# Patient Record
Sex: Female | Born: 1955
Health system: Southern US, Community
[De-identification: ages and names within clinical notes are randomized; demographics above are authoritative.]

## PROBLEM LIST (undated history)

## (undated) DIAGNOSIS — N289 Disorder of kidney and ureter, unspecified: Secondary | ICD-10-CM

## (undated) HISTORY — PX: APPENDECTOMY: SHX54

## (undated) HISTORY — PX: ABDOMINAL HYSTERECTOMY: SHX81

## (undated) HISTORY — PX: TONSILLECTOMY: SUR1361

## (undated) HISTORY — PX: CHOLECYSTECTOMY: SHX55

---

## 1998-03-24 ENCOUNTER — Ambulatory Visit (HOSPITAL_COMMUNITY): Admission: RE | Admit: 1998-03-24 | Discharge: 1998-03-24 | Payer: Self-pay | Admitting: Orthopedic Surgery

## 1999-06-24 ENCOUNTER — Other Ambulatory Visit: Admission: RE | Admit: 1999-06-24 | Discharge: 1999-06-24 | Payer: Self-pay | Admitting: Obstetrics and Gynecology

## 2001-10-16 ENCOUNTER — Other Ambulatory Visit: Admission: RE | Admit: 2001-10-16 | Discharge: 2001-10-16 | Payer: Self-pay | Admitting: Obstetrics and Gynecology

## 2003-12-30 ENCOUNTER — Other Ambulatory Visit: Admission: RE | Admit: 2003-12-30 | Discharge: 2003-12-30 | Payer: Self-pay | Admitting: Obstetrics and Gynecology

## 2004-01-08 ENCOUNTER — Encounter: Admission: RE | Admit: 2004-01-08 | Discharge: 2004-01-08 | Payer: Self-pay | Admitting: Obstetrics and Gynecology

## 2004-02-13 ENCOUNTER — Ambulatory Visit (HOSPITAL_COMMUNITY): Admission: RE | Admit: 2004-02-13 | Discharge: 2004-02-13 | Payer: Self-pay | Admitting: Obstetrics and Gynecology

## 2004-02-13 ENCOUNTER — Encounter (INDEPENDENT_AMBULATORY_CARE_PROVIDER_SITE_OTHER): Payer: Self-pay | Admitting: *Deleted

## 2004-08-12 ENCOUNTER — Encounter: Admission: RE | Admit: 2004-08-12 | Discharge: 2004-08-12 | Payer: Self-pay | Admitting: Obstetrics and Gynecology

## 2004-08-27 ENCOUNTER — Encounter (INDEPENDENT_AMBULATORY_CARE_PROVIDER_SITE_OTHER): Payer: Self-pay | Admitting: Specialist

## 2004-08-28 ENCOUNTER — Inpatient Hospital Stay (HOSPITAL_COMMUNITY): Admission: RE | Admit: 2004-08-28 | Discharge: 2004-08-29 | Payer: Self-pay | Admitting: Obstetrics and Gynecology

## 2005-06-13 ENCOUNTER — Encounter: Admission: RE | Admit: 2005-06-13 | Discharge: 2005-06-13 | Payer: Self-pay | Admitting: Orthopedic Surgery

## 2007-03-20 ENCOUNTER — Emergency Department (HOSPITAL_COMMUNITY): Admission: EM | Admit: 2007-03-20 | Discharge: 2007-03-20 | Payer: Self-pay | Admitting: Emergency Medicine

## 2007-06-30 ENCOUNTER — Emergency Department (HOSPITAL_COMMUNITY): Admission: EM | Admit: 2007-06-30 | Discharge: 2007-06-30 | Payer: Self-pay | Admitting: Emergency Medicine

## 2007-07-07 ENCOUNTER — Emergency Department (HOSPITAL_COMMUNITY): Admission: EM | Admit: 2007-07-07 | Discharge: 2007-07-07 | Payer: Self-pay | Admitting: Emergency Medicine

## 2007-10-26 ENCOUNTER — Emergency Department (HOSPITAL_COMMUNITY): Admission: EM | Admit: 2007-10-26 | Discharge: 2007-10-26 | Payer: Self-pay | Admitting: Emergency Medicine

## 2007-10-27 ENCOUNTER — Emergency Department (HOSPITAL_COMMUNITY): Admission: EM | Admit: 2007-10-27 | Discharge: 2007-10-27 | Payer: Self-pay | Admitting: Emergency Medicine

## 2008-03-29 ENCOUNTER — Emergency Department (HOSPITAL_COMMUNITY): Admission: EM | Admit: 2008-03-29 | Discharge: 2008-03-29 | Payer: Self-pay | Admitting: Emergency Medicine

## 2008-07-18 ENCOUNTER — Emergency Department (HOSPITAL_COMMUNITY): Admission: EM | Admit: 2008-07-18 | Discharge: 2008-07-18 | Payer: Self-pay | Admitting: Emergency Medicine

## 2009-01-05 ENCOUNTER — Emergency Department (HOSPITAL_COMMUNITY): Admission: EM | Admit: 2009-01-05 | Discharge: 2009-01-05 | Payer: Self-pay | Admitting: Family Medicine

## 2009-01-07 ENCOUNTER — Inpatient Hospital Stay (HOSPITAL_COMMUNITY): Admission: AD | Admit: 2009-01-07 | Discharge: 2009-01-07 | Payer: Self-pay | Admitting: Obstetrics and Gynecology

## 2009-05-04 ENCOUNTER — Emergency Department (HOSPITAL_COMMUNITY): Admission: EM | Admit: 2009-05-04 | Discharge: 2009-05-04 | Payer: Self-pay | Admitting: Emergency Medicine

## 2009-05-08 ENCOUNTER — Ambulatory Visit (HOSPITAL_BASED_OUTPATIENT_CLINIC_OR_DEPARTMENT_OTHER): Admission: RE | Admit: 2009-05-08 | Discharge: 2009-05-08 | Payer: Self-pay | Admitting: Urology

## 2009-08-13 ENCOUNTER — Emergency Department (HOSPITAL_COMMUNITY): Admission: EM | Admit: 2009-08-13 | Discharge: 2009-08-13 | Payer: Self-pay | Admitting: Emergency Medicine

## 2009-11-27 ENCOUNTER — Emergency Department (HOSPITAL_COMMUNITY): Admission: EM | Admit: 2009-11-27 | Discharge: 2009-11-27 | Payer: Self-pay | Admitting: Family Medicine

## 2010-06-01 ENCOUNTER — Emergency Department (HOSPITAL_COMMUNITY): Admission: EM | Admit: 2010-06-01 | Discharge: 2010-06-01 | Payer: Self-pay | Admitting: Family Medicine

## 2010-11-13 LAB — DIFFERENTIAL
Basophils Absolute: 0 10*3/uL (ref 0.0–0.1)
Basophils Relative: 0 % (ref 0–1)
Eosinophils Absolute: 0 10*3/uL (ref 0.0–0.7)
Eosinophils Relative: 0 % (ref 0–5)
Lymphocytes Relative: 24 % (ref 12–46)
Lymphs Abs: 2 10*3/uL (ref 0.7–4.0)
Monocytes Absolute: 0.5 10*3/uL (ref 0.1–1.0)
Monocytes Relative: 6 % (ref 3–12)
Neutro Abs: 5.8 10*3/uL (ref 1.7–7.7)
Neutrophils Relative %: 69 % (ref 43–77)

## 2010-11-13 LAB — URINALYSIS, ROUTINE W REFLEX MICROSCOPIC
Glucose, UA: NEGATIVE mg/dL
Ketones, ur: 15 mg/dL — AB
Nitrite: POSITIVE — AB
Protein, ur: 100 mg/dL — AB
Specific Gravity, Urine: 1.025 (ref 1.005–1.030)
Urobilinogen, UA: 0.2 mg/dL (ref 0.0–1.0)
pH: 5 (ref 5.0–8.0)

## 2010-11-13 LAB — CBC
HCT: 41.9 % (ref 36.0–46.0)
Hemoglobin: 14.4 g/dL (ref 12.0–15.0)
MCHC: 34.4 g/dL (ref 30.0–36.0)
MCV: 88 fL (ref 78.0–100.0)
Platelets: 246 10*3/uL (ref 150–400)
RBC: 4.76 MIL/uL (ref 3.87–5.11)
RDW: 13 % (ref 11.5–15.5)
WBC: 8.4 10*3/uL (ref 4.0–10.5)

## 2010-11-13 LAB — COMPREHENSIVE METABOLIC PANEL
ALT: 27 U/L (ref 0–35)
AST: 27 U/L (ref 0–37)
Albumin: 4 g/dL (ref 3.5–5.2)
Alkaline Phosphatase: 78 U/L (ref 39–117)
BUN: 11 mg/dL (ref 6–23)
CO2: 27 mEq/L (ref 19–32)
Calcium: 9.3 mg/dL (ref 8.4–10.5)
Chloride: 108 mEq/L (ref 96–112)
Creatinine, Ser: 1.06 mg/dL (ref 0.4–1.2)
GFR calc Af Amer: >60 mL/min (ref >60)
GFR calc non Af Amer: 54 mL/min — ABNORMAL LOW (ref >60)
Glucose, Bld: 112 mg/dL — ABNORMAL HIGH (ref 70–99)
Potassium: 4.1 mEq/L (ref 3.5–5.1)
Sodium: 139 mEq/L (ref 135–145)
Total Bilirubin: 0.8 mg/dL (ref 0.3–1.2)
Total Protein: 7.7 g/dL (ref 6.0–8.3)

## 2010-11-13 LAB — URINE MICROSCOPIC-ADD ON

## 2010-11-13 LAB — POCT HEMOGLOBIN-HEMACUE: Hemoglobin: 15.2 g/dL — ABNORMAL HIGH (ref 12.0–15.0)

## 2010-11-16 LAB — CULTURE, ROUTINE-ABSCESS

## 2010-12-25 NOTE — Discharge Summary (Signed)
[name], [name]           ACCOUNT NO.:  [id number]   MEDICAL RECORD NO.:  [id number]          PATIENT TYPE:  INP   LOCATION:  9311                          FACILITY:  [facility]   PHYSICIAN:  Juluis Mire, M.D.   DATE OF BIRTH:  02-Jul-1956   DATE OF ADMISSION:  08/27/2004  DATE OF DISCHARGE:                                 DISCHARGE SUMMARY   ADMITTING DIAGNOSIS:  Atypical andenomatous hyperplasia and uterine  fibroids.   DISCHARGE DIAGNOSIS:  Atypical adenomatous hyperplasia and uterine fibroids.   OPERATIVE PROCEDURE:  Laparoscopic-assisted vaginal hysterectomy with  bilateral salpingo-oophorectomy.   For complete history and physical, please see the dictated note.   COURSE IN THE HOSPITAL:  The patient underwent open laparoscopy.  She did  have multiple fibroids and large pelvic congestion.  She underwent a  laparoscopic-assisted vaginal hysterectomy with bilateral salpingo-  oophorectomy.  Pathology did reveal uterine fibroids.  There was no evidence  of persistent atypical hyperplasia.  She did well postoperatively.  Postoperative hemoglobin was 10.7.  She was discharged home on postoperative  day #2. She remained during the first due to an apparent ileus with  associated nausea requiring continued maintenance.  At the time of her  discharge, though, she was afebrile, tolerating a regular diet, she had  passed flatus.  All incisions were intact and clear.  She was voiding  without difficulty and had no active vaginal bleeding.   COMPLICATIONS:  None encountered during her stay in the hospital.  The  patient discharged home in stable condition.   DISPOSITION:  Routine postoperative instructions were given.  She was to  avoid heavy lifting, vaginal entrance, or driving a car.  She is to watch  for signs of infection, nausea, vomiting, increasing abdominal pain, or  active vaginal bleeding.  Follow-up in the office will be in 1 week.  Medications included Tylox as needed  for pain.      JSM/MEDQ  D:  08/29/2004  T:  08/29/2004  Job:  [id number]

## 2010-12-25 NOTE — H&P (Signed)
NAME:  [name], [name]                     ACCOUNT NO.:  [id number]   MEDICAL RECORD NO.:  [id number]                   PATIENT TYPE:  AMB   LOCATION:  SDC                                  [facility]ACILITY:  [facility]   PHYSICIAN:  Juluis Mire, M.D.                DATE OF BIRTH:  1955-08-13   DATE OF ADMISSION:  DATE OF DISCHARGE:                                HISTORY & PHYSICAL   The patient is a 56 year old gravida, para 2, black female who presents for  hysteroscopic evaluation.  The patient was experiencing abnormal uterine  bleeding.  Saline infusion ultrasound revealed two large endometrial polyps  along with uterine fibroids.  She now presents for a hysteroscopic  resection.   In terms of allergies, no known drug allergies.   MEDICATIONS:  None.   PAST MEDICAL HISTORY:  Usual childhood disease, no significant sequelae.   PAST SURGICAL HISTORY:  She has had a previous laparoscopic right salpingo-  oophorectomy with finding of a benign serous cystadenoma.  She has had a  cholecystectomy, appendectomy, and bilateral tubal ligation.   PAST SURGICAL HISTORY:  Two spontaneous vaginal deliveries.   FAMILY HISTORY:  Noncontributory.   SOCIAL HISTORY:  No tobacco or alcohol use.   REVIEW OF SYSTEMS:  Noncontributory.   PHYSICAL EXAMINATION:  VITAL SIGNS:  The patient is afebrile with stable  vital signs.  HEENT:  The patient is normocephalic, pupils equal, round, and reactive to  light and accommodation, extraocular movements were intact.  Sclerae and  conjunctivae clear, oropharynx clear.  NECK:  Without thyromegaly.  BREASTS:  No discrete masses.  CHEST:  Lungs clear.  CARDIAC:  Regular rhythm and rate without murmurs or gallops.  ABDOMEN:  Benign, no mass, organomegaly, or tenderness.  PELVIC:  Normal external genitalia.  Vaginal mucosa clear.  Cervix  unremarkable.  Uterus is enlarged.  There is a right-sided fullness  consistent with known uterine fibroids.  Adnexa  unremarkable.  Rectovaginal  exam is clear.  EXTREMITIES:  Trace edema.  NEUROLOGIC:  Grossly within normal limits.   IMPRESSION:  1. Abnormal uterine bleeding with evidence of endometrial polyp.  2. Uterine fibroids.   PLAN:  The patient will undergo hysteroscopic evaluation and resection of  polyps.  The risks have been discussed, including the risk of infection;  risk of hemorrhage that could require transfusion with the risk of AIDS or  hepatitis; risk of injury to adjacent organs that could require laparoscopy  or possible exploratory surgery; the risk of deep venous thrombosis and  pulmonary embolus; the risk of fluid overload leading to hyponatremia or  pulmonary edema.                                               Juluis Mire, M.D.    JSM/MEDQ  D:  02/13/2004  T:  02/13/2004  Job:  [id number]

## 2010-12-25 NOTE — Op Note (Signed)
NAME:  [name], [name]                     ACCOUNT NO.:  [id number]   MEDICAL RECORD NO.:  [id number]                   PATIENT TYPE:  AMB   LOCATION:  SDC                                  [facility]   PHYSICIAN:  Juluis Mire, M.D.                DATE OF BIRTH:  10/18/1955   DATE OF PROCEDURE:  02/13/2004  DATE OF DISCHARGE:                                 OPERATIVE REPORT   PREOPERATIVE DIAGNOSES:  1. Abnormal uterine bleeding.  2. Uterine fibroids.  3. Endometrial polyps.   POSTOPERATIVE DIAGNOSES:  1. Abnormal uterine bleeding.  2. Uterine fibroids.  3. Endometrial polyps.   1. Endocervical polyp.   PROCEDURES:  1. Cervical dilatation, hysteroscopy.  2. Resection of endometrial polyps.  3. Multiple endometrial biopsies.  4. Endometrial curettings.  5. Excision of endocervical polyp.   SURGEON:  Juluis Mire, M.D.   ANESTHESIA:  General anesthesia.   ESTIMATED BLOOD LOSS:  Minimal.   PACKS AND DRAINS:  None.   INTRAOPERATIVE BLOOD PLACED:  None.   COMPLICATIONS:  None.   INDICATIONS FOR PROCEDURE:  Dictated in history and physical.   DESCRIPTION OF PROCEDURE:  The patient taken to the OR and placed in the  supine position.  After satisfactory level of general anesthesia was  obtained, the patient was placed in the dorsal lithotomy position.  The  perineum and vagina were prepped with Betadine and draped for sterile field.  A speculum was placed in the vaginal vault.  The cervix was grasped with  single-tooth tenaculum.  An endocervical polyp was noted.  It was excised  and removed.  Uterus was retroverted and sounded to approximately 12 cm.  The cervix was serially dilated to a size 37 Pratt dilator.  Operative  hysteroscope was then introduced.  A posterior wall endometrial polyp was  noted and excised.  Multiple endometrial biopsies were obtained.  Endometrium was otherwise clear.  Endometrial curettings were also obtained.  At the end of the  procedure, we had no active bleeding or signs of  perforation.  The single-tooth tenaculum and speculum were then removed.  The patient was taken out of the dorsal lithotomy position, and once alert  and extubated, was transferred to the recovery room in good condition.  Sponge, needle and instrument counts were reported as correct by the  circulating nurse.                                               Juluis Mire, M.D.    JSM/MEDQ  D:  02/13/2004  T:  02/13/2004  Job:  [id number]

## 2010-12-25 NOTE — Op Note (Signed)
[name], [name]           ACCOUNT NO.:  [id number]   MEDICAL RECORD NO.:  [id number]          PATIENT TYPE:  AMB   LOCATION:  SDC                           [facility]   PHYSICIAN:  Juluis Mire, M.D.   DATE OF BIRTH:  1956/02/24   DATE OF PROCEDURE:  08/27/2004  DATE OF DISCHARGE:                                 OPERATIVE REPORT   PREOPERATIVE DIAGNOSIS:  Atypical adenomatous hyperplasia.  Uterine  fibroids.   POSTOPERATIVE DIAGNOSIS:  Atypical adenomatous hyperplasia.  Uterine  fibroids.   OPERATIVE PROCEDURE:  Laparoscopic assisted vaginal hysterectomy, bilateral  salpingo-oophorectomy.   SURGEON:  Juluis Mire, M.D.   ASSISTANT:  Raynald Kemp, M.D.   ANESTHESIA:  General endotracheal.   ESTIMATED BLOOD LOSS:  400 cubic centimeters.   PACKS AND DRAINS:  None.   INTRAOPERATIVE BLOOD REPLACEMENT:  None.   COMPLICATIONS:  None.   INDICATION:  See dictated history and physical.   DESCRIPTION OF PROCEDURE:  The patient was taken to the OR and placed in the  supine position.  After satisfactory level of general endotracheal  anesthesia was obtained, the patient was placed in the dorsal supine  position using the Allen stirrups.  The abdomen, peritoneum, and vagina were  prepped out with Betadine.  Bladder was emptied without catheterization.  A  Hulka tenaculum was put in place and secured.  The patient was then draped  out as a sterile field.  Subumbilical incision was made with the knife.  The  incision was extended through the subcutaneous tissue.  Fascia was  identified and entered sharply and incision pouch extended laterally.  Muscles were separated.  Peritoneum was entered.  The Taut open laparoscopic  trocar was put in place and secured.  The laparoscope was introduced.  There  was no incidental injury to adjacent organs.  The 5 mm trocar was put in  place in the suprapubic area under direct visualization.  Uterus was  enlarged approximately 2-3  times normal size by at least two fundal  fibroids.  Both ovaries appeared to be normal.  She had evidence of previous  bilateral tubal ligation.  First, we elevated the right ovary, identified  the ureter.  Then, using the Gyrus bipolar, we cauterized and incised the  ovarian vasculature.  We cauterized and incised the peritoneal attachment of  the ovary.  We then cauterized and incised the right round ligament.  We had  good hemostasis.  We then went to the left side.  The left ovary was  elevated.  Ureter was identified.  The left ovarian vasculature was  cauterized and incised.  Peritoneal attachment of the ovary was then  cauterized and incised, and the left round ligament was cauterized and  incised.  There was large vasculature in the pelvis due to the fibroids.  At  this point in time, we had adequate freeing of the ovaries.  The decision  was to go vaginally.  Abdomen was deflated with carbon dioxide.  The  laparoscope was removed.   The patient's legs were repositioned.  The Hulka tenaculum was then removed.  The weighted  speculum was placed in the vaginal vault.  The cervix was  grasped with the [facility] tenaculum.  Cul de sac was entered sharply.  The  left uterosacral ligaments were clamped, cut, and suture ligated with 0  Vicryl.  The reflection of the vaginal mucosa anteriorly was incised using  the Bovie, and the bladder was dissected superiorly.  Large venous areas  were noted and were bleeding.  These were brought under control with  clamping, cutting, and tying with suture ligatures of 0 Vicryl.  Next, the  paracervical tissue was clamped, cut, and suture ligated with 0 Vicryl.  The  vesicouterine space was identified and entered.  Using the clamp, cut, and  tie technique with suture ligatures of 0 Vicryl, the parametrium was  serially separated from the sides of the uterus.  Uterus was then flipped.  Remaining pedicles on the patient's right side was clamped and cut.   The  uterus, tubes, and ovaries were passed off the operative field.  Held  pedicle was secured with two free ties of 0 Vicryl.  We had good hemostasis.  Uterosacral plication stitch of 0 Vicryl was put in place and tied down.  Vaginal mucosa was reapproximated in the midline in a vertical fashion with  interrupted figure-of-eights of 0 Vicryl.  A sponge on a sponge stick was  placed in the vaginal vault.  A Foley was placed to straight drain with  adequate amount of clear urine.   At this point in time, the patient's legs were repositioned, the abdomen was  reinflated with carbon dioxide.  The laparoscope was reintroduced.  Visualization of vaginal cuff and both ovarian pedicles revealed good  hemostasis.  We clearly irrigated the pelvis, we de-inflated the pelvis,  revisualized, and no bleeding was encountered.  At this point in time, the  abdomen was deflated of carbon dioxide.  All trocars were removed.  Subumbilical fascia was closed with two figure-of-eights of 0 Vicryl, skin  and mucosa with interrupted subcuticulars of 4-0 Vicryl.  Suprapubic  incision was closed with Dermabond.  Sponge on a sponge stick was removed  from the vaginal vault.  The patient was taken out of the dorsal supine  position, extubated, and transferred to the recovery room in good condition.  Sponge, instrument, and needle counts were correct by circulating nurse x 2.  Foley catheter remained clear at time of closure.      JSM/MEDQ  D:  08/27/2004  T:  08/27/2004  Job:  [id number]

## 2010-12-25 NOTE — H&P (Signed)
NAME:  [name], [facility] NO.:  [id number]   MEDICAL RECORD NO.:  [id number]          PATIENT TYPE:  AMB   LOCATION:  SDC                           [facility]   PHYSICIAN:  Juluis Mire, M.D.   DATE OF BIRTH:  [birth year]   DATE OF ADMISSION:  08/27/2004  DATE OF DISCHARGE:                                HISTORY & PHYSICAL   The patient is a 55 year old gravida 2, para 2, black female who presents  for laparoscopically-assisted vaginal hysterectomy with bilateral salpingo-  oophorectomy.   In relation to the present admission, the patient had been followed in our  office for abnormal uterine bleeding.  She underwent saline infusion  ultrasound and finding of large polyps back in June of last year,  subsequently underwent a hysteroscopic evaluation with resection of polyps.  Pathology did reveal atypical adenomatous hyperplasia.  There was no  evidence of endometrial cancer.  The endometrial curettings were actually  negative and it appeared like the hyperplasia was isolated to the polyps.  In view of this, the patient now presents for laparoscopically-assisted  vaginal hysterectomy with bilateral salpingo-oophorectomy.   Her past history is also significant in that she underwent a previous  laparoscopic evaluation of pelvic pain and a cystic pelvic mass.  We did  find a cyst of the right ovary.  It was removed and turned out to be a  serous cystadenoma, which was benign.  There was no evidence of pelvic  pathology.  The uterus was upper limits of normal size.  This was back in  1993.   In terms of allergies, the patient has no known drug allergies.   Medications are none.   PAST MEDICAL HISTORY:  She has the usual childhood diseases without any  significant sequelae.   PAST SURGICAL HISTORY:  She has had an appendectomy in 1973, a  cholecystectomy in 1980, and a bilateral tubal ligation in 1981.  As noted  above, she had a laparoscopic evaluation and  right ovarian cystectomy.   OBSTETRICAL HISTORY:  She has had two vaginal deliveries.   FAMILY HISTORY:  Noncontributory.   SOCIAL HISTORY:  There is no tobacco or alcohol use.   REVIEW OF SYSTEMS:  Noncontributory.   PHYSICAL EXAMINATION:  VITAL SIGNS:  The patient is afebrile with stable  vital signs.  HEENT:  The patient is normocephalic.  Pupils are equally round, and  reactive to light and accommodation.  Extraocular movements were intact.  Sclerae and conjunctivae were clear, oropharynx clear.  NECK:  Without thyromegaly.  BREASTS:  No discrete masses.  CHEST:  Lungs clear.  CARDIAC:  Regular rhythm and rate, no murmurs or gallops.  ABDOMEN:  Benign, no mass, organomegaly or tenderness.  PELVIC:  Normal external genitalia.  Vaginal mucosa clear.  Cervix  unremarkable.  Uterus upper limits of normal size.  Adnexa unremarkable.  Rectovaginal exam is clear.  EXTREMITIES:  Trace edema.  NEUROLOGIC:  Grossly within normal limits.   IMPRESSION:  Endometrial atypical adenomatous hyperplasia.   PLAN:  The patient will undergo a laparoscopically-assisted vaginal  hysterectomy.  At her request, the ovaries will  be removed in view of her  past history of a serous cystadenoma.  The potential need for exogenous  hormone replacement therapy with its risks and benefits has been discussed.  The risks of surgery have been explained, including the risk of infection;  the risk of hemorrhage that could require transfusion with the risk of AIDS  or hepatitis; the risk of injury to adjacent organs, including bladder,  bowel or ureters that could require further exploratory surgery; the risk of  deep venous thrombosis and pulmonary embolus.  The patient expressed an  understanding of the indications and risks and acceptance of them.      JSM/MEDQ  D:  08/27/2004  T:  08/27/2004  Job:  [id number]

## 2011-05-14 LAB — POCT I-STAT, CHEM 8
BUN: 11 mg/dL (ref 6–23)
Calcium, Ion: 1.15 mmol/L (ref 1.12–1.32)
Chloride: 106 mEq/L (ref 96–112)
Creatinine, Ser: 1.1 mg/dL (ref 0.4–1.2)
Glucose, Bld: 101 mg/dL — ABNORMAL HIGH (ref 70–99)
HCT: 45 % (ref 36.0–46.0)
Hemoglobin: 15.3 g/dL — ABNORMAL HIGH (ref 12.0–15.0)
Potassium: 3.9 mEq/L (ref 3.5–5.1)
Sodium: 141 mEq/L (ref 135–145)
TCO2: 27 mmol/L (ref 0–100)

## 2011-05-18 LAB — RAPID STREP SCREEN (MED CTR MEBANE ONLY): Streptococcus, Group A Screen (Direct): NEGATIVE

## 2012-05-04 ENCOUNTER — Encounter (HOSPITAL_COMMUNITY): Payer: Self-pay | Admitting: *Deleted

## 2012-05-04 ENCOUNTER — Emergency Department (HOSPITAL_COMMUNITY)
Admission: EM | Admit: 2012-05-04 | Discharge: 2012-05-05 | Disposition: A | Discharge status: Home / Self Care | Payer: BC Managed Care – PPO | Attending: Emergency Medicine | Admitting: Emergency Medicine

## 2012-05-04 DIAGNOSIS — IMO0002 Reserved for concepts with insufficient information to code with codable children: Secondary | ICD-10-CM | POA: Insufficient documentation

## 2012-05-04 DIAGNOSIS — N289 Disorder of kidney and ureter, unspecified: Secondary | ICD-10-CM | POA: Insufficient documentation

## 2012-05-04 DIAGNOSIS — S01119A Laceration without foreign body of unspecified eyelid and periocular area, initial encounter: Secondary | ICD-10-CM

## 2012-05-04 DIAGNOSIS — S62309A Unspecified fracture of unspecified metacarpal bone, initial encounter for closed fracture: Secondary | ICD-10-CM

## 2012-05-04 DIAGNOSIS — S0180XA Unspecified open wound of other part of head, initial encounter: Secondary | ICD-10-CM | POA: Insufficient documentation

## 2012-05-04 HISTORY — DX: Disorder of kidney and ureter, unspecified: N28.9
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
## 2012-05-05 ENCOUNTER — Emergency Department (HOSPITAL_COMMUNITY): Payer: BC Managed Care – PPO

## 2012-05-05 MED ORDER — HYDROCODONE-ACETAMINOPHEN 5-325 MG PO TABS: 1.0000 | ORAL_TABLET | ORAL | Status: DC | PRN

## 2012-05-05 NOTE — ED Notes (Signed)
Ortho tech notified of orders. 
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
## 2012-05-30 NOTE — ED Provider Notes (Signed)
Medical screening examination/treatment/procedure(s) were performed by non-physician practitioner and as supervising physician I was immediately available for consultation/collaboration.  Kindra Bickham Lytle Michaels, MD 05/30/12 [phone]

## 2014-08-08 ENCOUNTER — Emergency Department (INDEPENDENT_AMBULATORY_CARE_PROVIDER_SITE_OTHER)
Admission: EM | Admit: 2014-08-08 | Discharge: 2014-08-08 | Disposition: A | Discharge status: Home / Self Care | Payer: BLUE CROSS/BLUE SHIELD | Source: Home / Self Care | Attending: Emergency Medicine | Admitting: Emergency Medicine

## 2014-08-08 ENCOUNTER — Encounter (HOSPITAL_COMMUNITY): Payer: Self-pay | Admitting: Emergency Medicine

## 2014-08-08 DIAGNOSIS — J069 Acute upper respiratory infection, unspecified: Secondary | ICD-10-CM

## 2014-08-08 DIAGNOSIS — J040 Acute laryngitis: Secondary | ICD-10-CM

## 2014-08-08 DIAGNOSIS — R05 Cough: Secondary | ICD-10-CM

## 2014-08-08 DIAGNOSIS — R0982 Postnasal drip: Secondary | ICD-10-CM

## 2014-08-08 DIAGNOSIS — R059 Cough, unspecified: Secondary | ICD-10-CM

## 2014-08-08 MED ORDER — PREDNISONE 20 MG PO TABS: ORAL_TABLET | ORAL | Status: DC

## 2014-08-08 NOTE — Discharge Instructions (Signed)
Cough, Adult To help with drainage Chlor Trimeton 2 to 4 mg every 4-6hours as needed for drainage Delsym or Robitussin DM for cough Lots of liquids Saline nasal spray Voice rest  A cough is a reflex that helps clear your throat and airways. It can help heal the body or may be a reaction to an irritated airway. A cough may only last 2 or 3 weeks (acute) or may last more than 8 weeks (chronic).  CAUSES Acute cough:  Viral or bacterial infections. Chronic cough:  Infections.  Allergies.  Asthma.  Post-nasal drip.  Smoking.  Heartburn or acid reflux.  Some medicines.  Chronic lung problems (COPD).  Cancer. SYMPTOMS   Cough.  Fever.  Chest pain.  Increased breathing rate.  High-pitched whistling sound when breathing (wheezing).  Colored mucus that you cough up (sputum). TREATMENT   A bacterial cough may be treated with antibiotic medicine.  A viral cough must run its course and will not respond to antibiotics.  Your caregiver may recommend other treatments if you have a chronic cough. HOME CARE INSTRUCTIONS   Only take over-the-counter or prescription medicines for pain, discomfort, or fever as directed by your caregiver. Use cough suppressants only as directed by your caregiver.  Use a cold steam vaporizer or humidifier in your bedroom or home to help loosen secretions.  Sleep in a semi-upright position if your cough is worse at night.  Rest as needed.  Stop smoking if you smoke. SEEK IMMEDIATE MEDICAL CARE IF:   You have pus in your sputum.  Your cough starts to worsen.  You cannot control your cough with suppressants and are losing sleep.  You begin coughing up blood.  You have difficulty breathing.  You develop pain which is getting worse or is uncontrolled with medicine.  You have a fever. MAKE SURE YOU:   Understand these instructions.  Will watch your condition.  Will get help right away if you are not doing well or get  worse. Document Released: 01/22/2011 Document Revised: 10/18/2011 Document Reviewed: 01/22/2011 [facility] Patient Information 2015 [place]ExitCare, [place]. This information is not intended to replace advice given to you by your health care provider. Make sure you discuss any questions you have with your health care provider.  Laryngitis At the top of your windpipe is your voice box. It is the source of your voice. Inside your voice box are 2 bands of muscles called vocal cords. When you breathe, your vocal cords are relaxed and open so that air can get into the lungs. When you decide to say something, these cords come together and vibrate. The sound from these vibrations goes into your throat and comes out through your mouth as sound. Laryngitis is an inflammation of the vocal cords that causes hoarseness, cough, loss of voice, sore throat, and dry throat. Laryngitis can be temporary (acute) or long-term (chronic). Most cases of acute laryngitis improve with time.Chronic laryngitis lasts for more than 3 weeks. CAUSES Laryngitis can often be related to excessive smoking, talking, or yelling, as well as inhalation of toxic fumes and allergies. Acute laryngitis is usually caused by a viral infection, vocal strain, measles or mumps, or bacterial infections. Chronic laryngitis is usually caused by vocal cord strain, vocal cord injury, postnasal drip, growths on the vocal cords, or acid reflux. SYMPTOMS   Cough.  Sore throat.  Dry throat. RISK FACTORS  Respiratory infections.  Exposure to irritating substances, such as cigarette smoke, excessive amounts of alcohol, stomach acids, and workplace chemicals.  Voice trauma, such as vocal cord injury from shouting or speaking too loud. DIAGNOSIS  Your cargiver will perform a physical exam. During the physical exam, your caregiver will examine your throat. The most common sign of laryngitis is hoarseness. Laryngoscopy may be necessary to confirm the diagnosis of  this condition. This procedure allows your caregiver to look into the larynx. HOME CARE INSTRUCTIONS  Drink enough fluids to keep your urine clear or pale yellow.  Rest until you no longer have symptoms or as directed by your caregiver.  Breathe in moist air.  Take all medicine as directed by your caregiver.  Do not smoke.  Talk as little as possible (this includes whispering).  Write on paper instead of talking until your voice is back to normal.  Follow up with your caregiver if your condition has not improved after 10 days. SEEK MEDICAL CARE IF:   You have trouble breathing.  You cough up blood.  You have persistent fever.  You have increasing pain.  You have difficulty swallowing. MAKE SURE YOU:  Understand these instructions.  Will watch your condition.  Will get help right away if you are not doing well or get worse. Document Released: 07/26/2005 Document Revised: 10/18/2011 Document Reviewed: 10/01/2010 [facility]ExitCare Patient Information 2015 [place]ExitCare, [place]LLC. This information is not intended to replace advice given to you by your health care provider. Make sure you discuss any questions you have with your health care provider.  Upper Respiratory Infection, Adult An upper respiratory infection (URI) is also known as the common cold. It is often caused by a type of germ (virus). Colds are easily spread (contagious). You can pass it to others by kissing, coughing, sneezing, or drinking out of the same glass. Usually, you get better in 1 or 2 weeks.  HOME CARE   Only take medicine as told by your doctor.  Use a warm mist humidifier or breathe in steam from a hot shower.  Drink enough water and fluids to keep your pee (urine) clear or pale yellow.  Get plenty of rest.  Return to work when your temperature is back to normal or as told by your doctor. You may use a face mask and wash your hands to stop your cold from spreading. GET HELP RIGHT AWAY IF:   After the first  few days, you feel you are getting worse.  You have questions about your medicine.  You have chills, shortness of breath, or brown or red spit (mucus).  You have yellow or brown snot (nasal discharge) or pain in the face, especially when you bend forward.  You have a fever, puffy (swollen) neck, pain when you swallow, or white spots in the back of your throat.  You have a bad headache, ear pain, sinus pain, or chest pain.  You have a high-pitched whistling sound when you breathe in and out (wheezing).  You have a lasting cough or cough up blood.  You have sore muscles or a stiff neck. MAKE SURE YOU:   Understand these instructions.  Will watch your condition.  Will get help right away if you are not doing well or get worse. Document Released: 01/12/2008 Document Revised: 10/18/2011 Document Reviewed: 10/31/2013 [facility] CampusExitCare Patient Information 2015 [place]ExitCare, [place]LL[place]. This information is not intended to replace advice given to you by your health care provider. Make sure you discuss any questions you have with your health care provider.

## 2014-08-08 NOTE — ED Notes (Signed)
Laryngitis, cough, congestion:symptoms started 12/24.  Lucila MaineGrandson has been sick and patient has visited nursing homes recently

## 2014-08-08 NOTE — ED Provider Notes (Signed)
CSN: [id number]     Arrival date & time 08/08/14  1215 History   First MD Initiated Contact with Patient 08/08/14 1325     Chief Complaint  Patient presents with  . URI   (Consider location/radiation/quality/duration/timing/severity/associated sxs/prior Treatment) HPI Comments: 58 year old female complaining of persistent cough and laryngitis for the past week. She is complaining of persistent PND that is worse through the night stimulates her to have cough, tickles in the throat and sneezing. Denies having fever or feeling badly. She has taken TheraFlu, Mucinex, NyQuil and Aleve.   Past Medical History  Diagnosis Date  . Renal disorder     kidney stones   Past Surgical History  Procedure Laterality Date  . Appendectomy    . Cholecystectomy    . Abdominal hysterectomy     No family history on file. History  Substance Use Topics  . Smoking status: Never Smoker   . Smokeless tobacco: Not on file  . Alcohol Use: No   OB History    No data available     Review of Systems  Constitutional: Negative for fever, chills, activity change, appetite change and fatigue.  HENT: Positive for congestion and postnasal drip. Negative for facial swelling, rhinorrhea and sinus pressure.   Eyes: Negative.   Respiratory: Positive for cough and choking. Negative for wheezing.   Cardiovascular: Negative.   Musculoskeletal: Negative for neck pain and neck stiffness.  Skin: Negative for pallor and rash.  Neurological: Negative.     Allergies  Latex and Tape  Home Medications   Prior to Admission medications   Medication Sig Start Date End Date Taking? Authorizing Provider  dextromethorphan (DELSYM) 30 MG/5ML liquid Take 60 mg by mouth as needed. For cough    Historical Provider, MD  HYDROcodone-acetaminophen (NORCO/VICODIN) 5-325 MG per tablet Take 1 tablet by mouth every 4 (four) hours as needed for pain. 05/05/12   Arie Sabinaatherine E Schinlever, PA-C  predniSONE (DELTASONE) 20 MG tablet Take 3  tabs po on first day, 2 tabs second day, 2 tabs third day, 1 tab fourth day, 1 tab 5th day. Take with food. 08/08/14   Hayden Rasmussenavid Issiac Jamar, NP   BP 153/79 mmHg  Pulse 65  Temp(Src) 98 F (36.7 C) (Oral)  Resp 18  SpO2 99% Physical Exam  Constitutional: She is oriented to person, place, and time. She appears well-developed and well-nourished. No distress.  HENT:  Mouth/Throat: No oropharyngeal exudate.  Bilateral TMs are normal Oropharynx mildly injected with clear PND. Airway widely patent.  Eyes: Conjunctivae and EOM are normal.  Neck: Normal range of motion. Neck supple.  Cardiovascular: Normal rate, regular rhythm and normal heart sounds.   Pulmonary/Chest: Effort normal and breath sounds normal. No respiratory distress. She has no wheezes. She has no rales.  Musculoskeletal: Normal range of motion. She exhibits no edema.  Lymphadenopathy:    She has no cervical adenopathy.  Neurological: She is alert and oriented to person, place, and time.  Skin: Skin is warm and dry. No rash noted.  Psychiatric: She has a normal mood and affect.  Nursing note and vitals reviewed.   ED Course  Procedures (including critical care time) Labs Review Labs Reviewed - No data to display  Imaging Review No results found.   MDM   1. URI (upper respiratory infection)   2. PND (post-nasal drip)   3. Laryngitis   4. Cough     To help with drainage Chlor Trimeton 2 to 4 mg every 4-6hours as needed for drainage  Delsym or Robitussin DM for cough Lots of liquids Saline nasal spray Voice rest Prednisone taper    Hayden Rasmussenavid Margaret Staggs, NP 08/08/14 1342

## 2014-12-02 ENCOUNTER — Emergency Department (HOSPITAL_COMMUNITY): Payer: BLUE CROSS/BLUE SHIELD

## 2014-12-02 ENCOUNTER — Emergency Department (HOSPITAL_COMMUNITY)
Admission: EM | Admit: 2014-12-02 | Discharge: 2014-12-02 | Disposition: A | Discharge status: Home / Self Care | Payer: BLUE CROSS/BLUE SHIELD | Attending: Emergency Medicine | Admitting: Emergency Medicine

## 2014-12-02 ENCOUNTER — Encounter (HOSPITAL_COMMUNITY): Payer: Self-pay | Admitting: *Deleted

## 2014-12-02 DIAGNOSIS — M25569 Pain in unspecified knee: Secondary | ICD-10-CM

## 2014-12-02 DIAGNOSIS — S8991XA Unspecified injury of right lower leg, initial encounter: Secondary | ICD-10-CM | POA: Insufficient documentation

## 2014-12-02 DIAGNOSIS — Y998 Other external cause status: Secondary | ICD-10-CM | POA: Insufficient documentation

## 2014-12-02 DIAGNOSIS — Y9389 Activity, other specified: Secondary | ICD-10-CM | POA: Insufficient documentation

## 2014-12-02 DIAGNOSIS — S0990XA Unspecified injury of head, initial encounter: Secondary | ICD-10-CM | POA: Diagnosis not present

## 2014-12-02 DIAGNOSIS — Z91040 Latex allergy status: Secondary | ICD-10-CM | POA: Insufficient documentation

## 2014-12-02 DIAGNOSIS — Y92410 Unspecified street and highway as the place of occurrence of the external cause: Secondary | ICD-10-CM | POA: Diagnosis not present

## 2014-12-02 DIAGNOSIS — H539 Unspecified visual disturbance: Secondary | ICD-10-CM | POA: Insufficient documentation

## 2014-12-02 DIAGNOSIS — Z87448 Personal history of other diseases of urinary system: Secondary | ICD-10-CM | POA: Diagnosis not present

## 2014-12-02 NOTE — ED Notes (Signed)
NP at bedside.
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
## 2014-12-02 NOTE — ED Notes (Signed)
Patient transported to CT 

## 2014-12-02 NOTE — Discharge Instructions (Signed)
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
## 2015-12-17 ENCOUNTER — Other Ambulatory Visit: Payer: Self-pay | Admitting: Nurse Practitioner

## 2015-12-17 DIAGNOSIS — R922 Inconclusive mammogram: Secondary | ICD-10-CM

## 2015-12-17 DIAGNOSIS — N644 Mastodynia: Secondary | ICD-10-CM

## 2015-12-23 ENCOUNTER — Ambulatory Visit
Admission: RE | Admit: 2015-12-23 | Discharge: 2015-12-23 | Disposition: A | Discharge status: Home / Self Care | Payer: BLUE CROSS/BLUE SHIELD | Source: Ambulatory Visit | Attending: Nurse Practitioner | Admitting: Nurse Practitioner

## 2015-12-23 DIAGNOSIS — R922 Inconclusive mammogram: Secondary | ICD-10-CM

## 2015-12-23 DIAGNOSIS — N644 Mastodynia: Secondary | ICD-10-CM

## 2016-12-09 ENCOUNTER — Ambulatory Visit (INDEPENDENT_AMBULATORY_CARE_PROVIDER_SITE_OTHER): Payer: BLUE CROSS/BLUE SHIELD | Admitting: Podiatry

## 2016-12-09 ENCOUNTER — Ambulatory Visit (INDEPENDENT_AMBULATORY_CARE_PROVIDER_SITE_OTHER): Payer: BLUE CROSS/BLUE SHIELD

## 2016-12-09 DIAGNOSIS — M205X9 Other deformities of toe(s) (acquired), unspecified foot: Secondary | ICD-10-CM

## 2016-12-09 DIAGNOSIS — M2020 Hallux rigidus, unspecified foot: Secondary | ICD-10-CM

## 2016-12-09 DIAGNOSIS — M79673 Pain in unspecified foot: Secondary | ICD-10-CM | POA: Diagnosis not present

## 2016-12-09 DIAGNOSIS — R52 Pain, unspecified: Secondary | ICD-10-CM

## 2016-12-09 DIAGNOSIS — L84 Corns and callosities: Secondary | ICD-10-CM

## 2016-12-10 NOTE — Progress Notes (Signed)
   Subjective:    Patient ID: [name], female    DOB: 01/28/1956, 61 y.o.   MRN: [id number]  HPI this patient presents the office with chief complaint of a painful callus under her big toe left foot.  She says this area has been painful for the last few weeks. She says the pain is related to her activity walking and wearing her shoes. She has provided no self treatment nor sought any professional help resents the office today for definitive evaluation and treatment of this condition    Review of Systems  All other systems reviewed and are negative.      Objective:   Physical Exam GENERAL APPEARANCE: Alert, conversant. Appropriately groomed. No acute distress.  VASCULAR: Pedal pulses are  palpable at  [facility]DP and PT bilateral.  Capillary refill time is immediate to all digits,  Normal temperature gradient.  Digital hair growth is present bilateral  NEUROLOGIC: sensation is normal to 5.07 monofilament at 5/5 sites bilateral.  Light touch is intact bilateral, Muscle strength normal.  MUSCULOSKELETAL: acceptable muscle strength, tone and stability bilateral.  Mild dorsomedial exostosis  1st MPJ  B/L FHL noted.  DERMATOLOGIC: skin color, texture, and turgor are within normal limits.  No preulcerative lesions or ulcers  are seen, no interdigital maceration noted.  No open lesions present.  Digital nails are asymptomatic. No drainage noted. Callus noted plantar medial aspect left greater than right hallux.          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  Discussed condition with patient. Told her that she is a candidate for kinetic wedge orthotics  RTC prn   Helane GuntherGregory Avi Archuleta DPM

## 2017-03-15 ENCOUNTER — Ambulatory Visit: Payer: BLUE CROSS/BLUE SHIELD | Admitting: Podiatry

## 2017-04-16 ENCOUNTER — Encounter (HOSPITAL_COMMUNITY): Payer: Self-pay | Admitting: Emergency Medicine

## 2017-04-16 ENCOUNTER — Ambulatory Visit (HOSPITAL_COMMUNITY)
Admission: EM | Admit: 2017-04-16 | Discharge: 2017-04-16 | Disposition: A | Discharge status: Home / Self Care | Payer: BLUE CROSS/BLUE SHIELD | Attending: Family Medicine | Admitting: Family Medicine

## 2017-04-16 DIAGNOSIS — J209 Acute bronchitis, unspecified: Secondary | ICD-10-CM | POA: Diagnosis not present

## 2017-04-16 DIAGNOSIS — R0982 Postnasal drip: Secondary | ICD-10-CM

## 2017-04-16 DIAGNOSIS — J208 Acute bronchitis due to other specified organisms: Secondary | ICD-10-CM

## 2017-04-16 DIAGNOSIS — R05 Cough: Secondary | ICD-10-CM

## 2017-04-16 DIAGNOSIS — J9801 Acute bronchospasm: Secondary | ICD-10-CM | POA: Diagnosis not present

## 2017-04-16 DIAGNOSIS — R059 Cough, unspecified: Secondary | ICD-10-CM

## 2017-04-16 MED ORDER — PREDNISONE 50 MG PO TABS: ORAL_TABLET | ORAL | 0 refills | Status: DC

## 2017-04-16 MED ORDER — ALBUTEROL SULFATE (2.5 MG/3ML) 0.083% IN NEBU
5.0000 mg | INHALATION_SOLUTION | Freq: Once | RESPIRATORY_TRACT | Status: AC
  Administered 2017-04-16: 5 mg via RESPIRATORY_TRACT

## 2017-04-16 MED ORDER — ALBUTEROL SULFATE HFA 108 (90 BASE) MCG/ACT IN AERS: 2.0000 | INHALATION_SPRAY | RESPIRATORY_TRACT | 0 refills | Status: AC | PRN

## 2017-04-16 MED ORDER — IPRATROPIUM-ALBUTEROL 0.5-2.5 (3) MG/3ML IN SOLN
3.0000 mL | Freq: Once | RESPIRATORY_TRACT | Status: AC
  Administered 2017-04-16: 3 mL via RESPIRATORY_TRACT

## 2017-04-16 MED ORDER — TRIAMCINOLONE ACETONIDE 40 MG/ML IJ SUSP
INTRAMUSCULAR | Status: AC
  Filled 2017-04-16: qty 1

## 2017-04-16 MED ORDER — TRIAMCINOLONE ACETONIDE 40 MG/ML IJ SUSP
40.0000 mg | Freq: Once | INTRAMUSCULAR | Status: AC
  Administered 2017-04-16: 40 mg via INTRAMUSCULAR

## 2017-04-16 MED ORDER — ALBUTEROL SULFATE (2.5 MG/3ML) 0.083% IN NEBU
INHALATION_SOLUTION | RESPIRATORY_TRACT | Status: AC
  Filled 2017-04-16: qty 6

## 2017-04-16 MED ORDER — IPRATROPIUM-ALBUTEROL 0.5-2.5 (3) MG/3ML IN SOLN
RESPIRATORY_TRACT | Status: AC
  Filled 2017-04-16: qty 3

## 2017-04-16 MED ORDER — SODIUM CHLORIDE 0.9 % IN NEBU
INHALATION_SOLUTION | RESPIRATORY_TRACT | Status: AC
  Filled 2017-04-16: qty 3

## 2017-04-16 NOTE — Discharge Instructions (Signed)
Use the albuterol inhaler 2 puffs every 4 hours for call spasms and wheeze. Start taking the prednisone once a day with food tomorrow. Also recommend taking an antihistamine such as Zyrtec or Allegra daily for the next few days. If you develop shortness of breath, fever, worsening breathing and coughing or chills sig medical attention promptly.

## 2017-04-16 NOTE — ED Triage Notes (Signed)
8/31, patient started with a cough, chest congestion, yellow and green phlegm,  Cough interrupts patient's sleep.

## 2017-04-16 NOTE — ED Provider Notes (Signed)
[facility]    CSN: [id number] Arrival date & time: 04/16/17  1316     History   Chief Complaint Chief Complaint  Patient presents with  . Cough    HPI [name] is a 61 y.o. female.   61 year old female complaining of frequent cough for the past week. She also has PND and is coughing up green-yellow sputum. In addition she has developed hoarseness. Denies fever or chills. Denies choking at night although her cough is worse at night. She does not smoke or have a diagnosis of asthma. Denies fever or chills.      Past Medical History:  Diagnosis Date  . Renal disorder    kidney stones    There are no active problems to display for this patient.   Past Surgical History:  Procedure Laterality Date  . ABDOMINAL HYSTERECTOMY    . APPENDECTOMY    . CHOLECYSTECTOMY    . TONSILLECTOMY      OB History    No data available       Home Medications    Prior to Admission medications   Medication Sig Start Date End Date Taking? Authorizing Provider  OVER THE COUNTER MEDICATION Over the counter-delsym robitussin   Yes [provider]  albuterol (PROVENTIL HFA;VENTOLIN HFA) 108 (90 Base) MCG/ACT inhaler Inhale 2 puffs into the lungs every 4 (four) hours as needed for wheezing or shortness of breath. 04/16/17   Hayden Rasmussen, NP  predniSONE (DELTASONE) 50 MG tablet 1 tab po daily for 6 days. Take with food. 04/16/17   Hayden Rasmussen, NP    Family History No family history on file.  Social History Social History  Substance Use Topics  . Smoking status: Never Smoker  . Smokeless tobacco: Not on file  . Alcohol use No     Allergies   Latex and Tape   Review of Systems Review of Systems  Constitutional: Negative.  Negative for chills and fever.  HENT: Positive for sore throat. Negative for congestion, ear pain, postnasal drip, rhinorrhea, sinus pressure and trouble swallowing.   Eyes: Negative.   Respiratory: Positive for cough. Negative for  shortness of breath.   Cardiovascular: Negative.   Gastrointestinal: Negative.   Neurological: Negative.   All other systems reviewed and are negative.    Physical Exam Triage Vital Signs ED Triage Vitals  Enc Vitals Group     BP 04/16/17 1441 120/70     Pulse Rate 04/16/17 1441 80     Resp 04/16/17 1441 20     Temp 04/16/17 1441 98.8 F (37.1 C)     Temp Source 04/16/17 1441 Oral     SpO2 04/16/17 1441 96 %     Weight --      Height --      Head Circumference --      Peak Flow --      Pain Score 04/16/17 1439 5     Pain Loc --      Pain Edu? --      Excl. in GC? --    No data found.   Updated Vital Signs BP 120/70 (BP Location: Left Arm) Comment (BP Location): large cuff  Pulse 80   Temp 98.8 F (37.1 C) (Oral)   Resp 20   SpO2 96%   Visual Acuity Right Eye Distance:   Left Eye Distance:   Bilateral Distance:    Right Eye Near:   Left Eye Near:    Bilateral Near:  Physical Exam  Constitutional: She is oriented to person, place, and time. She appears well-developed and well-nourished. No distress.  HENT:  Bilateral TMs are normal. Oropharynx with light erythema and light cobblestoning. Scant clear PND. No exudates  Neck: Normal range of motion. Neck supple.  Cardiovascular: Normal rate, regular rhythm and normal heart sounds.   Pulmonary/Chest: Effort normal. No respiratory distress. She has no rales.  Inspiration lungs are clear. Expirations with coarseness in wheeze. Prolonged expiratory phase. Taking a deep breath elicits coughing spasms.  Musculoskeletal: Normal range of motion. She exhibits no edema.  Lymphadenopathy:    She has no cervical adenopathy.  Neurological: She is alert and oriented to person, place, and time.  Skin: Skin is warm and dry. No rash noted.  Psychiatric: She has a normal mood and affect.  Nursing note and vitals reviewed.    UC Treatments / Results  Labs (all labs ordered are listed, but only abnormal results are  displayed) Labs Reviewed - No data to display  EKG  EKG Interpretation None       Radiology No results found.  Procedures Procedures (including critical care time)  Medications Ordered in UC Medications  ipratropium-albuterol (DUONEB) 0.5-2.5 (3) MG/3ML nebulizer solution 3 mL (3 mLs Nebulization Given 04/16/17 1541)  triamcinolone acetonide (KENALOG-40) injection 40 mg (40 mg Intramuscular Given 04/16/17 1536)  albuterol (PROVENTIL) (2.5 MG/3ML) 0.083% nebulizer solution 5 mg (5 mg Nebulization Given 04/16/17 1610)     Initial Impression / Assessment and Plan / UC Course  I have reviewed the triage vital signs and the nursing notes.  Pertinent labs & imaging results that were available during my care of the patient were reviewed by me and considered in my medical decision making (see chart for details). After the first DuoNeb the patient states she is no longer coughing as much as and does not have that consistent feeling of having to call. She is moving more air. Auscultation reveals much improved air movement. Occasional end-expiratory wheeze and coarseness primarily on the right. She again denies history of fever or shortness of breath at any time. 1635 hrs. She has finished her second albuterol neb. She states she is continuing to feel she breathing better., [place]. Air movement is good however she does have continued expiratory coarseness bilaterally although much less.   Use the albuterol inhaler 2 puffs every 4 hours for call spasms and wheeze. Start taking the prednisone once a day with food tomorrow. Also recommend taking an antihistamine such as Zyrtec or Allegra daily for the next few days. If you develop shortness of breath, fever, worsening breathing and coughing or chills sig medical attention promptly.    Final Clinical Impressions(s) / UC Diagnoses   Final diagnoses:  Cough  Bronchospasm  PND (post-nasal drip)  Viral bronchitis    New Prescriptions New  Prescriptions   ALBUTEROL (PROVENTIL HFA;VENTOLIN HFA) 108 (90 BASE) MCG/ACT INHALER    Inhale 2 puffs into the lungs every 4 (four) hours as needed for wheezing or shortness of breath.   PREDNISONE (DELTASONE) 50 MG TABLET    1 tab po daily for 6 days. Take with food.     Controlled Substance Prescriptions [place] Controlled Substance Registry consulted? Not Applicable   Hayden RasmussenMabe, Laiba Fuerte, NP 04/16/17 1642

## 2019-05-04 DIAGNOSIS — R252 Cramp and spasm: Secondary | ICD-10-CM | POA: Diagnosis not present

## 2019-05-04 DIAGNOSIS — M170 Bilateral primary osteoarthritis of knee: Secondary | ICD-10-CM | POA: Diagnosis not present

## 2019-05-04 DIAGNOSIS — S838X1S Sprain of other specified parts of right knee, sequela: Secondary | ICD-10-CM | POA: Diagnosis not present

## 2019-05-04 DIAGNOSIS — Z23 Encounter for immunization: Secondary | ICD-10-CM | POA: Diagnosis not present

## 2019-08-14 DIAGNOSIS — H5203 Hypermetropia, bilateral: Secondary | ICD-10-CM | POA: Diagnosis not present

## 2019-08-20 DIAGNOSIS — Z03818 Encounter for observation for suspected exposure to other biological agents ruled out: Secondary | ICD-10-CM | POA: Diagnosis not present

## 2020-02-07 ENCOUNTER — Other Ambulatory Visit: Payer: Self-pay

## 2020-02-07 ENCOUNTER — Ambulatory Visit (HOSPITAL_COMMUNITY)
Admission: EM | Admit: 2020-02-07 | Discharge: 2020-02-07 | Disposition: A | Discharge status: Home / Self Care | Payer: BC Managed Care – PPO

## 2020-02-07 ENCOUNTER — Encounter (HOSPITAL_COMMUNITY): Payer: Self-pay

## 2020-02-07 DIAGNOSIS — M25561 Pain in right knee: Secondary | ICD-10-CM | POA: Diagnosis not present

## 2020-02-07 DIAGNOSIS — M1990 Unspecified osteoarthritis, unspecified site: Secondary | ICD-10-CM

## 2020-02-07 MED ORDER — METHYLPREDNISOLONE ACETATE 80 MG/ML IJ SUSP
INTRAMUSCULAR | Status: AC
  Filled 2020-02-07: qty 1

## 2020-02-07 NOTE — ED Triage Notes (Addendum)
Pt presents today for right knee pain r/t arthritis with known diagnosis. Pt is followed by [facility] but could not get appt. Pt states she has been for same, received Cortizone shot that has provided relief in the past, requesting the same. Pt denies recent fall or trauma.

## 2020-02-07 NOTE — Discharge Instructions (Addendum)
You have received a steroid injection in the office today  Continue home regimen of ibuprofen and/or tylenol as needed  Follow up with orthopedics as needed

## 2020-02-07 NOTE — ED Provider Notes (Signed)
[facility]   [id number] 02/07/20 Arrival Time: 8182  XH:[id number] PAIN  SUBJECTIVE: History from: patient. [name] is a 64 y.o. female complains of right knee pain that began years ago but has gotten worse over the last day or two. Reports that she gets an injection in her knee every few years.  Reports that she is also seen orthopedics for this and they have x-rays for her she has been told that she needs a knee replacement.  She reports that she is not ready for surgery at this time.  Describes the pain is constant and achy.  Has tried OTC medications without relief.  Symptoms are made worse with activity.  Denies similar symptoms in the past.  Denies fever, chills, erythema, ecchymosis, effusion, weakness, numbness and tingling, saddle paresthesias, loss of bowel or bladder function.      ROS: As per HPI.  All other pertinent ROS negative.     Past Medical History:  Diagnosis Date  . Renal disorder    kidney stones   Past Surgical History:  Procedure Laterality Date  . ABDOMINAL HYSTERECTOMY    . APPENDECTOMY    . CHOLECYSTECTOMY    . TONSILLECTOMY     Allergies  Allergen Reactions  . Latex Hives       . Tape Hives   No current facility-administered medications on file prior to encounter.   Current Outpatient Medications on File Prior to Encounter  Medication Sig Dispense Refill  . albuterol (PROVENTIL HFA;VENTOLIN HFA) 108 (90 Base) MCG/ACT inhaler Inhale 2 puffs into the lungs every 4 (four) hours as needed for wheezing or shortness of breath. 1 Inhaler 0  . OVER THE COUNTER MEDICATION Over the counter-delsym robitussin    . predniSONE (DELTASONE) 50 MG tablet 1 tab po daily for 6 days. Take with food. 6 tablet 0   Social History   Socioeconomic History  . Marital status: Legally Separated    Spouse name: Not on file  . Number of children: Not on file  . Years of education: Not on file  . Highest education level: Not on file  Occupational History    . Not on file  Tobacco Use  . Smoking status: Never Smoker  Substance and Sexual Activity  . Alcohol use: No  . Drug use: No  . Sexual activity: Not on file  Other Topics Concern  . Not on file  Social History Narrative  . Not on file   Social Determinants of Health   Financial Resource Strain:   . Difficulty of Paying Living Expenses:   Food Insecurity:   . Worried About Programme researcher, broadcasting/film/video in the Last Year:   . Barista in the Last Year:   Transportation Needs:   . Freight forwarder (Medical):   Marland Kitchen Lack of Transportation (Non-Medical):   Physical Activity:   . Days of Exercise per Week:   . Minutes of Exercise per Session:   Stress:   . Feeling of Stress :   Social Connections:   . Frequency of Communication with Friends and Family:   . Frequency of Social Gatherings with Friends and Family:   . Attends Religious Services:   . Active Member of Clubs or Organizations:   . Attends Banker Meetings:   Marland Kitchen Marital Status:   Intimate Partner Violence:   . Fear of Current or Ex-Partner:   . Emotionally Abused:   Marland Kitchen Physically Abused:   . Sexually Abused:  History reviewed. No pertinent family history.  OBJECTIVE:  Vitals:   02/07/20 0934  BP: 132/75  Pulse: 81  Resp: 16  Temp: 98.5 F (36.9 C)  SpO2: 100%    General appearance: ALERT; in no acute distress.  Head: NCAT Lungs: Normal respiratory effort CV: Pedal pulses 2+ bilaterally. Cap refill < 2 seconds Musculoskeletal:  Inspection: Skin warm, dry, clear and intact without obvious erythema, effusion, or ecchymosis.  Palpation: Nontender to palpation ROM: FROM active and passive Skin: warm and dry Neurologic: Ambulates without difficulty; Sensation intact about the upper/ lower extremities Psychological: alert and cooperative; normal mood and affect  DIAGNOSTIC STUDIES:  No results found.   ASSESSMENT & PLAN:  1. Acute pain of right knee   2. Arthritis     Acute Knee  Pain Arthritis Injected R knee with 2% lidocaine and 80mg  Depomedrol Continue conservative management of rest, ice, and gentle stretches Take naproxen as needed for pain relief (may cause abdominal discomfort, ulcers, and GI bleeds avoid taking with other NSAIDs) Follow up with PCP if symptoms persist Return or go to the ER if you have any new or worsening symptoms (fever, chills, chest pain, abdominal pain, changes in bowel or bladder habits, pain radiating into lower legs)   Reviewed expectations re: course of current medical issues. Questions answered. Outlined signs and symptoms indicating need for more acute intervention. Patient verbalized understanding. After Visit Summary given.       , NP 02/07/20 1020

## 2020-02-29 DIAGNOSIS — R739 Hyperglycemia, unspecified: Secondary | ICD-10-CM | POA: Diagnosis not present

## 2020-02-29 DIAGNOSIS — Z13220 Encounter for screening for lipoid disorders: Secondary | ICD-10-CM | POA: Diagnosis not present

## 2020-02-29 DIAGNOSIS — R252 Cramp and spasm: Secondary | ICD-10-CM | POA: Diagnosis not present

## 2020-02-29 DIAGNOSIS — M1711 Unilateral primary osteoarthritis, right knee: Secondary | ICD-10-CM | POA: Diagnosis not present

## 2020-03-24 DIAGNOSIS — R7303 Prediabetes: Secondary | ICD-10-CM | POA: Diagnosis not present

## 2020-05-17 DIAGNOSIS — Z23 Encounter for immunization: Secondary | ICD-10-CM | POA: Diagnosis not present

## 2020-08-21 DIAGNOSIS — R102 Pelvic and perineal pain: Secondary | ICD-10-CM | POA: Diagnosis not present

## 2020-10-20 DIAGNOSIS — R1084 Generalized abdominal pain: Secondary | ICD-10-CM | POA: Diagnosis not present

## 2020-10-20 DIAGNOSIS — R310 Gross hematuria: Secondary | ICD-10-CM | POA: Diagnosis not present

## 2020-10-29 ENCOUNTER — Other Ambulatory Visit (HOSPITAL_COMMUNITY): Payer: Self-pay | Admitting: Urology

## 2020-10-29 DIAGNOSIS — R932 Abnormal findings on diagnostic imaging of liver and biliary tract: Secondary | ICD-10-CM

## 2020-10-30 ENCOUNTER — Other Ambulatory Visit: Payer: Self-pay | Admitting: Urology

## 2020-10-30 DIAGNOSIS — R932 Abnormal findings on diagnostic imaging of liver and biliary tract: Secondary | ICD-10-CM

## 2020-10-30 DIAGNOSIS — M9984 Other biomechanical lesions of sacral region: Secondary | ICD-10-CM

## 2020-11-07 ENCOUNTER — Ambulatory Visit (HOSPITAL_COMMUNITY): Payer: BC Managed Care – PPO

## 2020-11-12 ENCOUNTER — Encounter (HOSPITAL_COMMUNITY): Payer: Self-pay

## 2020-11-12 ENCOUNTER — Other Ambulatory Visit (HOSPITAL_COMMUNITY): Payer: BC Managed Care – PPO

## 2020-11-19 ENCOUNTER — Ambulatory Visit
Admission: RE | Admit: 2020-11-19 | Discharge: 2020-11-19 | Disposition: A | Discharge status: Home / Self Care | Payer: BC Managed Care – PPO | Source: Ambulatory Visit | Attending: Urology | Admitting: Urology

## 2020-11-19 ENCOUNTER — Other Ambulatory Visit: Payer: Self-pay

## 2020-11-19 DIAGNOSIS — R932 Abnormal findings on diagnostic imaging of liver and biliary tract: Secondary | ICD-10-CM

## 2020-11-19 DIAGNOSIS — K7689 Other specified diseases of liver: Secondary | ICD-10-CM | POA: Diagnosis not present

## 2020-11-19 DIAGNOSIS — J90 Pleural effusion, not elsewhere classified: Secondary | ICD-10-CM | POA: Diagnosis not present

## 2020-11-19 DIAGNOSIS — Z9049 Acquired absence of other specified parts of digestive tract: Secondary | ICD-10-CM | POA: Diagnosis not present

## 2020-11-19 MED ORDER — GADOBENATE DIMEGLUMINE 529 MG/ML IV SOLN
20.0000 mL | Freq: Once | INTRAVENOUS | Status: AC | PRN
  Administered 2020-11-19: 20 mL via INTRAVENOUS

## 2020-12-17 DIAGNOSIS — Z111 Encounter for screening for respiratory tuberculosis: Secondary | ICD-10-CM | POA: Diagnosis not present

## 2020-12-19 DIAGNOSIS — Z111 Encounter for screening for respiratory tuberculosis: Secondary | ICD-10-CM | POA: Diagnosis not present

## 2021-10-08 ENCOUNTER — Ambulatory Visit (INDEPENDENT_AMBULATORY_CARE_PROVIDER_SITE_OTHER): Payer: No Typology Code available for payment source | Admitting: Sports Medicine

## 2021-10-08 ENCOUNTER — Ambulatory Visit (INDEPENDENT_AMBULATORY_CARE_PROVIDER_SITE_OTHER): Payer: No Typology Code available for payment source

## 2021-10-08 ENCOUNTER — Other Ambulatory Visit: Payer: Self-pay

## 2021-10-08 DIAGNOSIS — M7661 Achilles tendinitis, right leg: Secondary | ICD-10-CM

## 2021-10-08 DIAGNOSIS — M79671 Pain in right foot: Secondary | ICD-10-CM | POA: Diagnosis not present

## 2021-10-08 DIAGNOSIS — M7671 Peroneal tendinitis, right leg: Secondary | ICD-10-CM | POA: Diagnosis not present

## 2021-10-08 MED ORDER — PREDNISONE 10 MG (21) PO TBPK: ORAL_TABLET | ORAL | 0 refills | Status: AC

## 2021-10-08 MED ORDER — TRIAMCINOLONE ACETONIDE 10 MG/ML IJ SUSP
10.0000 mg | Freq: Once | INTRAMUSCULAR | Status: AC
  Administered 2021-10-08: 10 mg

## 2021-10-08 NOTE — Progress Notes (Signed)
Subjective: ?[name] is a 66 y.o. female patient who presents to office for evaluation of Right heel pain. Patient complains of progressive pain especially over the last year in the Right foot at the Achilles  and side of the heel feels like the bone is pulling through. Ranks pain 8/10 and is now interferring with daily activities. Patient has tried rest and states that it feels better with standing states that she has difficulty especially when she goes to walk her dog.  Patient denies any known trauma or injury.  Patient denies any other pedal complaints.  ? ?There are no problems to display for this patient. ? ? ?Current Outpatient Medications on File Prior to Visit  ?Medication Sig Dispense Refill  ? albuterol (PROVENTIL HFA;VENTOLIN HFA) 108 (90 Base) MCG/ACT inhaler Inhale 2 puffs into the lungs every 4 (four) hours as needed for wheezing or shortness of breath. 1 Inhaler 0  ? OVER THE COUNTER MEDICATION Over the counter-delsym ?robitussin    ? predniSONE (DELTASONE) 50 MG tablet 1 tab po daily for 6 days. Take with food. 6 tablet 0  ? ?No current facility-administered medications on file prior to visit.  ? ? ?Allergies  ?Allergen Reactions  ? Latex Hives  ?   ?  ? Silicone Hives  ? Tape Hives  ? ? ?Objective:  ?General: Alert and oriented x3 in no acute distress ? ?Dermatology: No open lesions bilateral lower extremities, no webspace macerations, no ecchymosis bilateral, all nails x 10 are well manicured. ? ?Vascular: Dorsalis Pedis and Posterior Tibial pedal pulses 1/4, Capillary Fill Time 3 seconds, + pedal hair growth bilateral, no edema bilateral lower extremities, Temperature gradient within normal limits. ? ?Neurology: Gross sensation intact via light touch bilateral.  ? ?Musculoskeletal: Mild to moderate tenderness with palpation at insertion of the Achilles on Right, there is calcaneal exostosis with mild soft tissue present and decreased ankle rom with knee extending  vs flexed resembling  gastroc equnius bilateral, The achilles tendon feels intact with no nodularity or palpable dell, Thompson sign negative, there is also mild pain along the lateral heel likely from peroneal compensation on the right, subtalar and midtarsal joint range of motion is within normal limits, there is no 1st ray hypermobility or forefoot deformity noted bilateral.  ? ?Gait: Antalgic gait ? ?Xrays  ?Right ?  Impression: ?Normal osseous mineralization. Joint spaces preserved. No fracture/dislocation/boney destruction.  Inferior and posterior calcaneal spur present. Kager's triangle intact with no obliteration. No soft tissue abnormalities or radiopaque foreign bodies.  ? ?Assessment and Plan: ?Problem List Items Addressed This Visit   ?None ?Visit Diagnoses   ? ? Tendonitis, Achilles, right    -  Primary  ? Peroneal tendinitis of right lower extremity      ? Pain of right heel      ? ?  ? ? ?-Complete examination performed ?-Xrays reviewed ?-Discussed treatement options ?-After oral consent and aseptic prep, injected a mixture containing 1 ml of 2%  ?plain lidocaine, 1 ml 0.5% plain marcaine, 0.5 ml of kenalog 10 and 0.5 ml of dexamethasone phosphate into right heel at the Achilles insertion with care not to violate the tendon at the lateral aspect of the tendon without complication. Post-injection care discussed with patient.  ?-Rx prednisone for patient to take as directed for additional pain and-Dispensed night splint for gentle stretching passively to avoid over pulling of the Achilles ?-Advised a strict regimen of icing every evening ?-Continue with shoes that do not rub or  irritate the back of the heel ?-No improvement will consider cam boot/MRI/PT/EPAT ?-Patient to return to office as scheduled in 1 month or sooner if condition worsens. ? ?Asencion Islam, DPM ? ?

## 2021-10-09 ENCOUNTER — Telehealth: Payer: Self-pay | Admitting: *Deleted

## 2021-10-09 NOTE — Telephone Encounter (Signed)
Patient is calling to let the doctor know that she is walking very well,just has real bad headaches and hiccups all the time but everything else is good. ?

## 2021-10-13 NOTE — Telephone Encounter (Signed)
Patient has been notified thru vmessage of Dr Leeanne Rio recommendations.

## 2021-10-29 ENCOUNTER — Ambulatory Visit: Payer: No Typology Code available for payment source | Admitting: Sports Medicine

## 2021-11-19 ENCOUNTER — Ambulatory Visit (INDEPENDENT_AMBULATORY_CARE_PROVIDER_SITE_OTHER): Payer: No Typology Code available for payment source | Admitting: Sports Medicine

## 2021-11-19 DIAGNOSIS — M7671 Peroneal tendinitis, right leg: Secondary | ICD-10-CM

## 2021-11-19 DIAGNOSIS — M7661 Achilles tendinitis, right leg: Secondary | ICD-10-CM

## 2021-11-19 DIAGNOSIS — M79671 Pain in right foot: Secondary | ICD-10-CM | POA: Diagnosis not present

## 2021-11-19 MED ORDER — DICLOFENAC SODIUM 75 MG PO TBEC: 75.0000 mg | DELAYED_RELEASE_TABLET | Freq: Two times a day (BID) | ORAL | 0 refills | Status: AC

## 2021-11-19 NOTE — Progress Notes (Signed)
Subjective: ?[name] is a 66 y.o. female patient who presents to office for follow-up evaluation of right heel pain.  Patient reports that it still hurts when she is very active 7 out of 10 hurts with first few steps states that when she was on the prednisone she did not have any pain and felt really good but now that she is off that medication is still having pain.  Patient reports that the night splint seems to help some feels like it is stretching her.  Patient denies any other pedal complaints at this time. ? ?There are no problems to display for this patient. ? ? ?Current Outpatient Medications on File Prior to Visit  ?Medication Sig Dispense Refill  ? albuterol (PROVENTIL HFA;VENTOLIN HFA) 108 (90 Base) MCG/ACT inhaler Inhale 2 puffs into the lungs every 4 (four) hours as needed for wheezing or shortness of breath. 1 Inhaler 0  ? OVER THE COUNTER MEDICATION Over the counter-delsym ?robitussin    ? predniSONE (STERAPRED UNI-PAK 21 TAB) 10 MG (21) TBPK tablet Take as directed 21 tablet 0  ? ?No current facility-administered medications on file prior to visit.  ? ? ?Allergies  ?Allergen Reactions  ? Latex Hives  ?   ?  ? Silicone Hives  ? Tape Hives  ? ? ?Objective:  ?General: Alert and oriented x3 in no acute distress ? ?Dermatology: No open lesions bilateral lower extremities, no webspace macerations, no ecchymosis bilateral, all nails x 10 are well manicured. ? ?Vascular: Dorsalis Pedis and Posterior Tibial pedal pulses 1/4, Capillary Fill Time 3 seconds, + pedal hair growth bilateral, no edema bilateral lower extremities, Temperature gradient within normal limits. ? ?Neurology: Gross sensation intact via light touch bilateral.  ? ?Musculoskeletal: Mild tenderness with palpation at insertion of the Achilles on Right, there is calcaneal exostosis with mild soft tissue present and decreased ankle rom with knee extending  vs flexed resembling gastroc equnius bilateral, The achilles tendon feels intact  with no nodularity or palpable dell, Thompson sign negative, there is also mild pain along the lateral heel likely from peroneal compensation on the right, subtalar and midtarsal joint range of motion is within normal limits, there is no 1st ray hypermobility or forefoot deformity noted bilateral.  ? ?Assessment and Plan: ?Problem List Items Addressed This Visit   ?None ?Visit Diagnoses   ? ? Tendonitis, Achilles, right    -  Primary  ? Relevant Orders  ? Ambulatory referral to Physical Therapy  ? Peroneal tendinitis of right lower extremity      ? Relevant Orders  ? Ambulatory referral to Physical Therapy  ? Pain of right heel      ? Relevant Orders  ? Ambulatory referral to Physical Therapy  ? ?  ? ? ?-Complete examination performed ?-Discussed continued care for Achilles tendinitis greater than peroneal tendinitis on the right heel ?-Rx diclofenac gel as well as oral diclofenac for patient to use as instructed for pain and inflammation ?-Advised a strict regimen of icing every evening like previous to prevent flareup ?-Continue with night splint as directed ?-Continue with shoes that do not rub or irritate the back of the heel ?-Recommend patient to start physical therapy at [facility] to help with stretching out Achilles and to help decrease inflammation and pain at the heel ?-Return to office in 3 to 4 weeks for follow-up evaluation ? ?Landis Martins, DPM ? ?

## 2021-12-24 ENCOUNTER — Ambulatory Visit: Payer: No Typology Code available for payment source | Admitting: Sports Medicine

## 2023-08-18 DIAGNOSIS — Z13820 Encounter for screening for osteoporosis: Secondary | ICD-10-CM | POA: Diagnosis not present

## 2023-08-18 DIAGNOSIS — E785 Hyperlipidemia, unspecified: Secondary | ICD-10-CM | POA: Diagnosis not present

## 2023-09-01 ENCOUNTER — Ambulatory Visit: Payer: BC Managed Care – PPO | Admitting: Endocrinology

## 2023-09-01 ENCOUNTER — Encounter: Payer: BC Managed Care – PPO | Attending: Endocrinology | Admitting: Dietician

## 2023-09-01 ENCOUNTER — Encounter: Payer: Self-pay | Admitting: Endocrinology

## 2023-09-01 VITALS — BP 136/70 | HR 65 | Resp 16 | Ht 69.0 in | Wt 217.8 lb

## 2023-09-01 DIAGNOSIS — R7309 Other abnormal glucose: Secondary | ICD-10-CM

## 2023-09-01 DIAGNOSIS — R7303 Prediabetes: Secondary | ICD-10-CM | POA: Insufficient documentation

## 2023-09-01 LAB — POCT GLYCOSYLATED HEMOGLOBIN (HGB A1C): Hemoglobin A1C: 5.8 % — AB (ref 4.0–5.6)

## 2023-09-01 MED ORDER — LANCET DEVICE MISC: 1.0000 | Freq: Three times a day (TID) | 0 refills | Status: AC

## 2023-09-01 MED ORDER — BLOOD GLUCOSE MONITORING SUPPL DEVI: 1.0000 | Freq: Three times a day (TID) | 0 refills | Status: AC

## 2023-09-01 MED ORDER — BLOOD GLUCOSE TEST VI STRP: 1.0000 | ORAL_STRIP | Freq: Every day | 3 refills | Status: AC

## 2023-09-01 MED ORDER — LANCETS MISC. MISC: 1.0000 | Freq: Every day | 3 refills | Status: AC

## 2023-09-01 NOTE — Progress Notes (Signed)
Outpatient Endocrinology Note [place] Fiore Detjen, MD   Patient's Name: [name]    DOB: 08-Jun-1956    MRN: [id number]                                                    REASON OF VISIT: New consult for prediabetes   REFERRING PROVIDER: Richardean Chimera, MD  PCP: Collene Mares, [place]  HISTORY OF PRESENT ILLNESS:   Kwame Carro is a 68 y.o. old female with past medical history listed below, is here for new consult for prediabetes.   Pertinent History: Patient had laboratory test in February 16, 2023, had elevated hemoglobin A1c of 6% and referred to endocrinology for further evaluation and management of prediabetes.  Patient has changed some of the diet since she was aware about having high blood sugar.  She has not been on any antidiabetic medication.  She has not been monitoring blood sugar at home.  No family history of diabetes in sister and parents.  Generally she does not take medication and preferred not to be on medication..   Factors modifying glucose control: 1.  Diabetic diet assessment: She has improved her carbohydrate and avoiding snacks at bedtime and overnight, still eating candies and cakes at times.  2.  Staying active or exercising: Walking with the dog at least 2 times a week.  Physically active at home.  3.  Medication compliance: compliant n/a of the time.  Interval history  Patient presented for the evaluation and management of prediabetes.  She denies any vision problem.  No numbness and tingling of the feet.  No other complaints today.  Hemoglobin A1c today in the clinic 5.8%, improved.  REVIEW OF SYSTEMS As per history of present illness.   PAST MEDICAL HISTORY: Past Medical History:  Diagnosis Date   Renal disorder    kidney stones    PAST SURGICAL HISTORY: Past Surgical History:  Procedure Laterality Date   ABDOMINAL HYSTERECTOMY     APPENDECTOMY     CHOLECYSTECTOMY     TONSILLECTOMY      ALLERGIES: Allergies  Allergen Reactions    Latex Hives        Silicone Hives   Tape Hives    FAMILY HISTORY:  History reviewed. No pertinent family history.  SOCIAL HISTORY: Social History   Socioeconomic History   Marital status: Legally Separated    Spouse name: Not on file   Number of children: Not on file   Years of education: Not on file   Highest education level: Not on file  Occupational History   Not on file  Tobacco Use   Smoking status: Never   Smokeless tobacco: Not on file  Substance and Sexual Activity   Alcohol use: No   Drug use: No   Sexual activity: Not on file  Other Topics Concern   Not on file  Social History Narrative   Not on file   Social Drivers of Health   Financial Resource Strain: Not on file  Food Insecurity: Not on file  Transportation Needs: Not on file  Physical Activity: Not on file  Stress: Not on file  Social Connections: Not on file    MEDICATIONS:  Current Outpatient Medications  Medication Sig Dispense Refill   Blood Glucose Monitoring Suppl DEVI 1 each by Does not apply route in the morning, at  noon, and at bedtime. May substitute to any manufacturer covered by patient's insurance. 1 each 0   Glucose Blood (BLOOD GLUCOSE TEST STRIPS) STRP 1 each by In Vitro route daily. May substitute to any manufacturer covered by patient's insurance. 100 each 3   Lancet Device MISC 1 each by Does not apply route in the morning, at noon, and at bedtime. May substitute to any manufacturer covered by patient's insurance. 1 each 0   Lancets Misc. MISC 1 each by Does not apply route daily. May substitute to any manufacturer covered by patient's insurance. 100 each 3   albuterol (PROVENTIL HFA;VENTOLIN HFA) 108 (90 Base) MCG/ACT inhaler Inhale 2 puffs into the lungs every 4 (four) hours as needed for wheezing or shortness of breath. (Patient not taking: Reported on 09/01/2023) 1 Inhaler 0   diclofenac (VOLTAREN) 75 MG EC tablet Take 1 tablet (75 mg total) by mouth 2 (two) times daily.  (Patient not taking: Reported on 09/01/2023) 30 tablet 0   OVER THE COUNTER MEDICATION Over the counter-delsym robitussin (Patient not taking: Reported on 09/01/2023)     predniSONE (STERAPRED UNI-PAK 21 TAB) 10 MG (21) TBPK tablet Take as directed (Patient not taking: Reported on 09/01/2023) 21 tablet 0   No current facility-administered medications for this visit.    PHYSICAL EXAM: Vitals:   09/01/23 1500  BP: 136/70  Pulse: 65  Resp: 16  SpO2: 97%  Weight: 217 lb 12.8 oz (98.8 kg)  Height: 5\' 9"  (1.753 m)   Body mass index is 32.16 kg/m.  Wt Readings from Last 3 Encounters:  09/01/23 217 lb 12.8 oz (98.8 kg)  12/02/14 230 lb (104.3 kg)    General: Well developed, well nourished female in no apparent distress.  HEENT: AT/[place], no external lesions.  Eyes: Conjunctiva clear and no icterus. Neck: Neck supple  Lungs: Respirations not labored Neurologic: Alert, oriented, normal speech Extremities / Skin: Dry. No sores or rashes noted.  Psychiatric: Does not appear depressed or anxious  Diabetic Foot Exam - Simple   No data filed    LABS Reviewed Lab Results  Component Value Date   HGBA1C 5.8 (A) 09/01/2023   No results found for: "FRUCTOSAMINE" No results found for: "CHOL", "HDL", "LDLCALC", "LDLDIRECT", "TRIG", "CHOLHDL" No results found for: "MICRALBCREAT" Lab Results  Component Value Date   CREATININE 1.06 05/04/2009   No results found for: "GFR"  ASSESSMENT / PLAN  1. Prediabetes   2. Abnormal glucose    -Patient has prediabetes with hemoglobin A1c of 6% in July 2024.  She had improvement on her diet since then.  Hemoglobin A1c today improved to 5.8%. -Discussed that patient does not necessarily need antidiabetic medication at this time she has been able to be improved and maintain controlled blood sugar on her lifestyle modification and improvement on the diet.  Plan: -No plan for antidiabetic medication or metformin at this time. -Encouraged to continue on  diet control, portion control and limiting carbohydrates, avoiding frequently snacks.  Discussed in detail. -Advised to have regular exercise at least walking. -Referred to diabetic educator/dietitian for the diet plan and patient is interested for this visit as well.  Input appreciated.  Patient will be seeing today. -Patient is asked to check blood sugar 1-2 times a week.  Glucometer and test supplies sent. -Will follow-up in 6 months. -Patient will find a primary care provider and if her blood sugar remained acceptable, she can continue to follow-up with primary care provider as well.  Diagnoses and all  orders for this visit:  Prediabetes -     Blood Glucose Monitoring Suppl DEVI; 1 each by Does not apply route in the morning, at noon, and at bedtime. May substitute to any manufacturer covered by patient's insurance. -     Glucose Blood (BLOOD GLUCOSE TEST STRIPS) STRP; 1 each by In Vitro route daily. May substitute to any manufacturer covered by patient's insurance. -     Lancet Device MISC; 1 each by Does not apply route in the morning, at noon, and at bedtime. May substitute to any manufacturer covered by patient's insurance. -     Lancets Misc. MISC; 1 each by Does not apply route daily. May substitute to any manufacturer covered by patient's insurance. -     Amb Referral to Nutrition and Diabetic Education  Abnormal glucose -     POCT glycosylated hemoglobin (Hb A1C)    DISPOSITION Follow up in clinic in 6  months suggested.   All questions answered and patient verbalized understanding of the plan.  [place] Aaniyah Strohm, MD [facility] Endocrinology [facility] [address] [place], [address] [place], [place] [address] Phone # [phone]  At least part of this note was generated using voice recognition software. Inadvertent word errors may have occurred, which were not recognized during the proofreading process.

## 2023-09-01 NOTE — Progress Notes (Signed)
Patient is here today as a walk in for prediabetes. A1C 5.8% 09/01/2023  Patient states that she was doing quite well with her eating this summer when first diagnosed with prediabetes but that since October she has been eating more cake and candy and has regained her weight.  Weight hx: 217 lbs 09/01/2023 205 lbs 05/2023 230 lbs 11/2014  24 hour recall: Breakfast:  Yogurt Lunch:  Leftover [place] Snack:  Turtle candy Dinner:  [place] (1/2 portion Snack:  Skinny Popcorn Beverages:  Vitamin Water (100 calories) (dislikes water), [employer] coffee  Exercise:  Stationary bike in the living room but does not use this.  Does do some walking.  Patient lives with her husband who eats increased amounts of sweets.  Education: Discussed insulin resistance and prediabetes/diabetes Discussed numbers of when prediabetes is diagnosed Discussed benefits of weight loss for prediabetes Discussed nutrition, choose lean protein, non-starchy vegetables, whole grains, fruits, drink mostly water or other options without carbohydrates.   Discussed to choose little added fat, especially saturated fat. Mindful eating Benefits of exercise.  Handouts: Label reading Snack lit Meal plan card Diabetes resources  Patient to call for further questions.  [name], RD, LDN, CDCES, DipACLM

## 2023-09-01 NOTE — Patient Instructions (Addendum)
Mindfulness:  Consistently scheduled meal - avoid skipping  Choices  Eat slowly  Away from distraction (sitting in kitchen or dining room)  Stop eating when satisfied  Before a snack ask, "Am I hungry or eating for another reason?"   "What can I do instead if I am not hungry?"  Try to find something every day that brings you joy!  Aim for 30 minutes of walking/biking most days.   Eat more Non-Starchy Vegetables These include greens, broccoli, cauliflower, cabbage, carrots, beets, eggplant, peppers, squash and others. Minimize added sugars and refined grains Rethink what you drink.  Choose beverages without added sugar.  Look for 0 carbs on the label. See the list of whole grains below.  Find alternatives to usual sweet treats. Choose whole foods over processed. Make simple meals at home more often than eating out.  Tips to increase fiber in your diet: (All plants have fiber.  Eat a variety. There are more than are on this list.) Slowly increase the amount of fiber you eat to 25-35 grams per day.  (More is fine if you tolerate it.) Fiber from whole grains, nuts and seeds Quinoa, 1/2 cup = 5 grams Bulgur, 1/2 cup = 4.1 grams Popcorn, 3 cups = 3.6 grams Whole Wheat Spaghetti, 1/2 cup = 3.2 grams Barley, 1/2 cup = 3 grams Oatmeal, 1/2 cup = 2 grams Whole Wheat English Muffin = 3 grams Corn, 1/2 cup = 2.1 grams Brown Rice, 1/2 cup = 1.8 grams Flax seeds, 1 Tablespoon = 2.8 grams Chia seeds, 1 Tablespoon = 11 grams Almonds, 1 ounce = 3.5 grams fiber Fiber from legumes Kidney beans, 1/2 cup 7.9 grams Lentils, 1/2 cup = 7.8 grams Pinto beans, 1/2 cup = 7.7 grams Black beans, 1/2 cup = 7.6 grams Lima beans, 1/2 cup 6.4 grams Chick peas, 1/2 cup = 5.3 grams Black eyed peas, 1/2 cup = 4 grams Fiber from fruits and vegetables Pear, 6 grams Apple. 3.3 grams Raspberries or Blackberries, 3/4 cup = 6 grams Strawberries or Blueberries, 1 cup = 3.4 grams Baked sweet potato 3.8 grams  fiber Baked potato with skin 4.4 grams  Peas, 1/2 cup = 4.4 grams  Spinach, 1/2 cup cooked = 3.5 grams  Avocado, 1/2 = 5 grams

## 2024-02-28 DIAGNOSIS — Z6832 Body mass index (BMI) 32.0-32.9, adult: Secondary | ICD-10-CM | POA: Diagnosis not present

## 2024-02-28 DIAGNOSIS — Z01419 Encounter for gynecological examination (general) (routine) without abnormal findings: Secondary | ICD-10-CM | POA: Diagnosis not present

## 2024-02-28 DIAGNOSIS — Z13220 Encounter for screening for lipoid disorders: Secondary | ICD-10-CM | POA: Diagnosis not present

## 2024-02-28 DIAGNOSIS — Z131 Encounter for screening for diabetes mellitus: Secondary | ICD-10-CM | POA: Diagnosis not present

## 2024-02-28 DIAGNOSIS — Z1321 Encounter for screening for nutritional disorder: Secondary | ICD-10-CM | POA: Diagnosis not present

## 2024-02-28 DIAGNOSIS — Z13228 Encounter for screening for other metabolic disorders: Secondary | ICD-10-CM | POA: Diagnosis not present

## 2024-02-28 DIAGNOSIS — Z1329 Encounter for screening for other suspected endocrine disorder: Secondary | ICD-10-CM | POA: Diagnosis not present

## 2024-02-28 DIAGNOSIS — Z1272 Encounter for screening for malignant neoplasm of vagina: Secondary | ICD-10-CM | POA: Diagnosis not present

## 2024-03-06 ENCOUNTER — Ambulatory Visit: Payer: BC Managed Care – PPO | Admitting: Endocrinology

## 2024-05-03 DIAGNOSIS — Z1231 Encounter for screening mammogram for malignant neoplasm of breast: Secondary | ICD-10-CM | POA: Diagnosis not present
